# Patient Record
Sex: Female | Born: 2000 | Race: White | Hispanic: No | Smoking: Never smoker
Health system: Southern US, Community
[De-identification: ages and names within clinical notes are randomized; demographics above are authoritative.]

## PROBLEM LIST (undated history)

## (undated) DIAGNOSIS — Z7689 Persons encountering health services in other specified circumstances: Secondary | ICD-10-CM

## (undated) DIAGNOSIS — M549 Dorsalgia, unspecified: Secondary | ICD-10-CM

## (undated) DIAGNOSIS — M419 Scoliosis, unspecified: Secondary | ICD-10-CM

## (undated) DIAGNOSIS — F32A Depression, unspecified: Secondary | ICD-10-CM

## (undated) DIAGNOSIS — F419 Anxiety disorder, unspecified: Secondary | ICD-10-CM

## (undated) DIAGNOSIS — R39198 Other difficulties with micturition: Secondary | ICD-10-CM

## (undated) HISTORY — DX: Anxiety disorder, unspecified: F41.9

## (undated) HISTORY — PX: WISDOM TOOTH EXTRACTION: SHX21

## (undated) HISTORY — DX: Scoliosis, unspecified: M41.9

## (undated) HISTORY — DX: Persons encountering health services in other specified circumstances: Z76.89

## (undated) HISTORY — DX: Other difficulties with micturition: R39.198

## (undated) HISTORY — DX: Depression, unspecified: F32.A

## (undated) HISTORY — DX: Dorsalgia, unspecified: M54.9

---
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
## 2020-09-13 ENCOUNTER — Ambulatory Visit (HOSPITAL_COMMUNITY): Payer: Self-pay | Admitting: Licensed Clinical Social Worker

## 2020-10-10 ENCOUNTER — Ambulatory Visit (INDEPENDENT_AMBULATORY_CARE_PROVIDER_SITE_OTHER): Payer: BC Managed Care – PPO | Admitting: Licensed Clinical Social Worker

## 2020-10-10 DIAGNOSIS — F329 Major depressive disorder, single episode, unspecified: Secondary | ICD-10-CM | POA: Diagnosis not present

## 2020-10-10 DIAGNOSIS — F4010 Social phobia, unspecified: Secondary | ICD-10-CM | POA: Diagnosis not present

## 2020-10-10 DIAGNOSIS — F411 Generalized anxiety disorder: Secondary | ICD-10-CM

## 2020-10-10 NOTE — Progress Notes (Signed)
Virtual Visit via Telephone Note  I connected with Teresa Miller on 10/10/20 at  3:00 PM EST by telephone and verified that I am speaking with the correct person using two identifiers.  Location: Patient: home  Provider: home office   I discussed the limitations, risks, security and privacy concerns of performing an evaluation and management service by telephone and the availability of in person appointments. I also discussed with the patient that there may be a patient responsible charge related to this service. The patient expressed understanding and agreed to proceed.   I discussed the assessment and treatment plan with the patient. The patient was provided an opportunity to ask questions and all were answered. The patient agreed with the plan and demonstrated an understanding of the instructions.   The patient was advised to call back or seek an in-person evaluation if the symptoms worsen or if the condition fails to improve as anticipated.  I provided 40 minutes of non-face-to-face time during this encounter.  THERAPIST PROGRESS NOTE  Session Time: 3:00 PM to 3:40 PM  Participation Level: Active  Behavioral Response: CasualAlertEuthymic  Type of Therapy: Individual Therapy  Treatment Goals addressed:  patient continue to work on mood regulation skills, effective coping strategies, stress management skills Interventions: CBT, DBT, Solution Focused, Strength-based, Supportive and Other: coping  Summary: Teresa Miller is a 20 y.o. female who presents with a couple of months ago got really depressed, a lot of mental break downs like before and didn't know what caused it. She thinks it was just the season. In was just December. Was first Christmas without great Grandfather. He lived with patient. Passed February 20th of 2021. Not sure why, went to hospital and put in rehabilitation place and passed away. Reviewed reason the season might have caused depression, that it is cold  outside and stuck at home. Has been with boyfriend over three years and lives with him. Moved out to [place]. It is a lot of better, only negative is that she misses sister but sees everyday at work. Therapist noted patient yawining in session and she said worked at 12 hours yesterday. Still works at [employer]. Therapist asked about her supports. She was talking to supports telling them she was upset and asking for help. Her sister asked her what happened today, something happen that make her upset. In other words breaking things down, looking more closely at the day to figure out the trigger. She was helping. Usually when depressed at night time and boss sending her to different stores causing and anxiety and panic, new places and night time overwhelming. Talked to bosses asked if could work during the day so not there late at night. When depressed at night when in head and think the worst. She has addressed that. She goes to sleep at 10 PM, and boyfriend there if up and somebody to talk to if need to if wake up during the night. Graduated from high school really improved things. Has a nephew in March or May will be 39 year old. 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 Shared that she likes kids "they are fun they don't judge like the rest of the world." This was a cousin that lived with her. Dad still has a girlfriend, has a fiancee they got engaged. When she wants to have fun sleeps. Works a lot. Body too tired to do anything. On her own better than with family but hard. Still help family out. A little irritated but as long  as they are surviving ok.  Reviewed the need for continued session and patient feels not needed but will contact office if needed.  Therapist reviewed any significant changes in mood and functioning as patient was last seen in April 2020.  Reviewed increase in depression in December which is better.  Bided positive feedback for patient's coping strategy of exploring what had been happening  day to day to uncover what had triggered depression.  Reviewed there is a chain of events and interventions involve addressing and changing the patterns that are causing the depression.  Provided positive feedback for patient using coping strategies when she uncovered sources of depression including talking to her boss and also changing her pattern at night so she did not sit up and think worsening depression.  Encourage patient to do this on an ongoing basis as a coping strategy when she notes a mood shift.  Therapist also utilized CBT to talk about how thoughts and actions can play a part in depression, noticed negative thinking and challenge it also being more active can help with depression as the counteracts the action urge of depression to isolate and do less.  Noted stability patient's life is a positive and noted her supports is helpful.  Reviewed ongoing need for therapy and patient's depression is improved so does not need to follow-up although she will contact therapist when she needs her.  Therapist provided active listening, open questions, supportive interventions Suicidal/Homicidal: No  Plan: 1.patient contact therapist as needed for sessions but currently stable. 2.  Patient continue with helpful strategies she has been using to help with mood management as well as incorporating ones discussed today in session.    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

## 2021-04-27 ENCOUNTER — Ambulatory Visit (HOSPITAL_COMMUNITY): Payer: BC Managed Care – PPO | Admitting: Licensed Clinical Social Worker

## 2021-04-27 NOTE — Progress Notes (Signed)
Therapist contacted patient by phone for session and left a message. Session is a no show  

## 2021-07-27 DIAGNOSIS — J029 Acute pharyngitis, unspecified: Secondary | ICD-10-CM | POA: Diagnosis not present

## 2021-07-27 DIAGNOSIS — R509 Fever, unspecified: Secondary | ICD-10-CM | POA: Diagnosis not present

## 2021-07-27 DIAGNOSIS — R059 Cough, unspecified: Secondary | ICD-10-CM | POA: Diagnosis not present

## 2021-07-27 DIAGNOSIS — Z20822 Contact with and (suspected) exposure to covid-19: Secondary | ICD-10-CM | POA: Diagnosis not present

## 2021-11-07 ENCOUNTER — Ambulatory Visit (INDEPENDENT_AMBULATORY_CARE_PROVIDER_SITE_OTHER): Payer: BC Managed Care – PPO | Admitting: Licensed Clinical Social Worker

## 2021-11-07 DIAGNOSIS — F4323 Adjustment disorder with mixed anxiety and depressed mood: Secondary | ICD-10-CM
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
## 2021-12-06 ENCOUNTER — Ambulatory Visit (HOSPITAL_COMMUNITY): Payer: BC Managed Care – PPO | Admitting: Licensed Clinical Social Worker

## 2021-12-06 NOTE — Progress Notes (Signed)
?  Therapist contacted patient by phone for session and she did not respond. Voice message full could not leave a message. Sessio is a no show ? ? ?

## 2021-12-14 ENCOUNTER — Ambulatory Visit: Payer: BC Managed Care – PPO | Admitting: Medical-Surgical

## 2021-12-25 DIAGNOSIS — Z20822 Contact with and (suspected) exposure to covid-19: Secondary | ICD-10-CM | POA: Diagnosis not present

## 2021-12-25 DIAGNOSIS — B349 Viral infection, unspecified: Secondary | ICD-10-CM | POA: Diagnosis not present

## 2022-07-11 ENCOUNTER — Ambulatory Visit (INDEPENDENT_AMBULATORY_CARE_PROVIDER_SITE_OTHER): Payer: BC Managed Care – PPO | Admitting: Family Medicine

## 2022-07-11 DIAGNOSIS — Z91199 Patient's noncompliance with other medical treatment and regimen due to unspecified reason: Secondary | ICD-10-CM

## 2022-07-11 NOTE — Progress Notes (Signed)
No Show

## 2022-07-29 ENCOUNTER — Ambulatory Visit: Payer: BC Managed Care – PPO | Admitting: Family Medicine

## 2022-08-01 ENCOUNTER — Ambulatory Visit: Payer: BC Managed Care – PPO | Admitting: Family Medicine

## 2022-09-04 DIAGNOSIS — R300 Dysuria: Secondary | ICD-10-CM | POA: Diagnosis not present

## 2022-09-04 DIAGNOSIS — R319 Hematuria, unspecified: Secondary | ICD-10-CM | POA: Diagnosis not present

## 2022-09-23 DIAGNOSIS — Z3042 Encounter for surveillance of injectable contraceptive: Secondary | ICD-10-CM | POA: Diagnosis not present

## 2022-11-11 ENCOUNTER — Encounter: Payer: Self-pay | Admitting: Family Medicine

## 2022-11-11 ENCOUNTER — Ambulatory Visit (INDEPENDENT_AMBULATORY_CARE_PROVIDER_SITE_OTHER): Payer: BC Managed Care – PPO | Admitting: Family Medicine

## 2022-11-11 VITALS — BP 113/82 | HR 105 | Ht 62.0 in | Wt 95.0 lb

## 2022-11-11 DIAGNOSIS — Z0000 Encounter for general adult medical examination without abnormal findings: Secondary | ICD-10-CM | POA: Diagnosis not present

## 2022-11-11 NOTE — Progress Notes (Signed)
Subjective:    CC: Establish care.   HPI:   Pt presents today for new patient visit and physical.   Current tobacco smoker: vapes; changes cartridges 2-3 weeks  Mom-thyroid cancer   Caffeine: 3 monsters a day   Pt admits to temperature changes, brittle hair   Past medical history, Surgical history, Family history not pertinant except as noted below, Social history, Allergies, and medications have been entered into the medical record, reviewed, and no changes needed.   Review of Systems: No headache, visual changes, nausea, vomiting, diarrhea, constipation, dizziness, abdominal pain, skin rash, fevers, chills, night sweats, swollen lymph nodes, weight loss, chest pain, body aches, joint swelling, muscle aches, shortness of breath, mood changes, visual or auditory hallucinations.  Objective:    General: Well Developed, well nourished, and in no acute distress.  Neuro: Alert and oriented x3, extra-ocular muscles intact, sensation grossly intact.  HEENT: Normocephalic, atraumatic, pupils equal round reactive to light, neck supple, no masses, no lymphadenopathy, thyroid nonpalpable.  Skin: Warm and dry, no rashes noted.  Cardiac: Regular rate and rhythm, no murmurs rubs or gallops.  Respiratory: Clear to auscultation bilaterally. Not using accessory muscles, speaking in full sentences.  Abdominal: Soft, nontender, nondistended, positive bowel sounds, no masses, no organomegaly.  Musculoskeletal: Shoulder, elbow, wrist, hip, knee, ankle stable, and with full range of motion.  Impression and Recommendations:    The patient was counselled, risk factors were discussed, anticipatory guidance given.  Adult health maintenance  - due for pap--pt will come at a later date  - ordered CBC, CMP, TSH (symptoms of temperature changes, brittle nails, constipation), lipid panel, and A1C (family hx of T1DM)    Angelys Yetman S. Mel Almond, DO

## 2022-11-11 NOTE — Patient Instructions (Signed)
So nice meeting you today!   Keep track of your uti symptoms and bowel movements  Come back for blood work (no fasting required)   Come back in one month for pap smear

## 2022-11-12 ENCOUNTER — Ambulatory Visit: Payer: BC Managed Care – PPO | Admitting: Family Medicine

## 2022-11-20 ENCOUNTER — Ambulatory Visit (INDEPENDENT_AMBULATORY_CARE_PROVIDER_SITE_OTHER): Payer: BC Managed Care – PPO | Admitting: Family Medicine

## 2022-11-20 DIAGNOSIS — Z91199 Patient's noncompliance with other medical treatment and regimen due to unspecified reason: Secondary | ICD-10-CM

## 2022-11-20 NOTE — Progress Notes (Signed)
     Established patient visit   Patient: Teresa Miller   DOB: 2000/11/19   22 y.o. Female  MRN: [id number] Visit Date: 11/20/2022   No Show   Owens Loffler, DO  [facility] [phone] (phone) [phone] (fax)  [place]

## 2023-03-12 DIAGNOSIS — R012 Other cardiac sounds: Secondary | ICD-10-CM | POA: Diagnosis not present

## 2023-03-12 DIAGNOSIS — R142 Eructation: Secondary | ICD-10-CM | POA: Diagnosis not present

## 2023-07-22 ENCOUNTER — Ambulatory Visit (INDEPENDENT_AMBULATORY_CARE_PROVIDER_SITE_OTHER): Payer: BC Managed Care – PPO | Admitting: Family Medicine

## 2023-07-22 VITALS — BP 120/84 | HR 93 | Ht 62.0 in | Wt 99.2 lb

## 2023-07-22 DIAGNOSIS — R142 Eructation: Secondary | ICD-10-CM | POA: Diagnosis not present

## 2023-07-22 NOTE — Progress Notes (Signed)
Established patient visit   Patient: Teresa Miller   DOB: Jul 02, 2001   22 y.o. Female  MRN: [id number] Visit Date: 07/22/2023  Today's healthcare provider: Charlton Amor, DO   Chief Complaint  Patient presents with   burping    X 3 months    SUBJECTIVE    Chief Complaint  Patient presents with   burping    X 3 months   HPI HPI     burping    Additional comments: X 3 months      Last edited by Roselyn Reef, CMA on 07/22/2023  9:41 AM.      Pt presents with concerns of burping for the past three months. She went to urgent care in July for this and was given a prescription of pantoprazole. She says this has not helped.   Review of Systems  Constitutional:  Negative for activity change, fatigue and fever.  Respiratory:  Negative for cough and shortness of breath.   Cardiovascular:  Negative for chest pain.  Gastrointestinal:  Negative for abdominal pain.       Burping  Genitourinary:  Negative for difficulty urinating.       No outpatient medications have been marked as taking for the 07/22/23 encounter (Office Visit) with Charlton Amor, DO.    OBJECTIVE    BP 120/84 (BP Location: Left Arm, Patient Position: Sitting, Cuff Size: Small)   Pulse 93   Ht 5\' 2"  (1.575 m)   Wt 99 lb 4 oz (45 kg)   SpO2 100%   BMI 18.15 kg/m   Physical Exam Vitals and nursing note reviewed.  Constitutional:      General: She is not in acute distress.    Appearance: Normal appearance.  HENT:     Head: Normocephalic and atraumatic.     Right Ear: External ear normal.     Left Ear: External ear normal.     Nose: Nose normal.  Eyes:     Conjunctiva/sclera: Conjunctivae normal.  Cardiovascular:     Rate and Rhythm: Normal rate and regular rhythm.  Pulmonary:     Effort: Pulmonary effort is normal.     Breath sounds: Normal breath sounds.  Abdominal:     General: Abdomen is flat. Bowel sounds are normal.     Palpations: Abdomen is soft.     Tenderness: There is  no abdominal tenderness.  Neurological:     General: No focal deficit present.     Mental Status: She is alert and oriented to person, place, and time.  Psychiatric:        Mood and Affect: Mood normal.        Behavior: Behavior normal.        Thought Content: Thought content normal.        Judgment: Judgment normal.        ASSESSMENT & PLAN    Problem List Items Addressed This Visit       Other   Burping - Primary    Belching is indicative of excessive gas/bloating - will do a trial of ppi. She only took ppi for 2 days and stopped. I suggested we do it for more like 2 weeks  - also provided pt with a diaphragmatic breathing exercise to try and reduce belching - discussed dietary modifications--reduce gas producing foods-given low FODMAP diet to follow - cbc ordered to eval for anemia and celiac sprue - referral to GI if not better      Relevant  Orders   CBC with Differential   Ambulatory referral to Gastroenterology     No follow-ups on file.      No orders of the defined types were placed in this encounter.   Orders Placed This Encounter  Procedures   CBC with Differential   Ambulatory referral to Gastroenterology    Referral Priority:   Routine    Referral Type:   Consultation    Referral Reason:   Specialty Services Required    Number of Visits Requested:   1     Charlton Amor, DO  [facility] at [facility] [phone] (phone) [phone] (fax)  [facility]

## 2023-07-22 NOTE — Assessment & Plan Note (Addendum)
Belching is indicative of excessive gas/bloating - will do a trial of ppi. She only took ppi for 2 days and stopped. I suggested we do it for more like 2 weeks  - also provided pt with a diaphragmatic breathing exercise to try and reduce belching - discussed dietary modifications--reduce gas producing foods-given low FODMAP diet to follow - cbc ordered to eval for anemia and celiac sprue - referral to GI if not better

## 2023-08-05 ENCOUNTER — Encounter: Payer: Self-pay | Admitting: Pediatrics

## 2023-09-25 NOTE — Progress Notes (Deleted)
[facility] Initial Consultation   Referring Provider Charlton Amor, DO [address] [place],  [place] [address]  Primary Care Provider Charlton Amor, DO  Patient Profile: Teresa Miller is a 23 y.o. female who is seen in consultation in the [facility] at the request of Dr. Tamera Punt for evaluation and management of the problem(s) noted below.  Problem List: Belching/eructation   History of Present Illness   Teresa Miller is a 23 y.o. female with a history of ***.   Last colonoscopy: *** Last endoscopy: ***  Last Abd CT/CTE/MRE: ***  GI Review of Symptoms Significant for {GIROS:50592}. Otherwise negative.  General Review of Systems  Review of systems is significant for the pertinent positives and negatives as listed per the HPI.  Full ROS is otherwise negative.  Past Medical History   Past Medical History:  Diagnosis Date   Anxiety    Back pain    Depression    Difficulty urinating    Mild scoliosis    Sleep concern      Past Surgical History   Past Surgical History:  Procedure Laterality Date   WISDOM TOOTH EXTRACTION     age [age]     Allergies and Medications   Allergies  Allergen Reactions   Penicillins     @MEDSTODAY @  Family History   Family History  Problem Relation Age of Onset   Hypertension Mother    Cancer Mother    Thyroid disease Mother    Hyperlipidemia Mother    Depression Mother    Anxiety disorder Mother    Diabetes Father    Diabetes Paternal Aunt    Diabetes Paternal Grandmother    Diabetes Paternal Grandfather    Stroke Other      Social History   Social History   Tobacco Use   Smoking status: Never   Smokeless tobacco: Never  Vaping Use   Vaping status: Every Day   Substances: Nicotine, Flavoring  Substance Use Topics   Alcohol use: Never   Drug use: Never   Talya reports that she has never smoked. She has never used smokeless tobacco. She reports that she  does not drink alcohol and does not use drugs.  Vital Signs and Physical Examination  There were no vitals filed for this visit. There is no height or weight on file to calculate BMI.    General: Well developed, well nourished, no acute distress Head: Normocephalic and atraumatic Eyes: Sclerae anicteric, EOMI Ears: Normal auditory acuity Mouth: No deformities or lesions noted Lungs: Clear throughout to auscultation Heart: Regular rate and rhythm; No murmurs, rubs or bruits Abdomen: Soft, non tender and non distended. No masses, hepatosplenomegaly or hernias noted. Normal Bowel sounds Rectal: Musculoskeletal: Symmetrical with no gross deformities  Pulses:  Normal pulses noted Extremities: No edema or deformities noted Neurological: Alert oriented x 4, grossly nonfocal Psychological:  Alert and cooperative. Normal mood and affect  Review of Data  The following data was reviewed at the time of this encounter:  Laboratory Studies       No data to display          No results found for: "LIPASE"     No data to display           Imaging Studies    GI Procedures and Studies      Clinical Impression  It is my clinical impression that Teresa Miller is a 23 y.o. female with;  ***  Plan  *** *** *** *** ***  Planned Follow Up No follow-ups on file.  The patient or caregiver verbalized understanding of the material covered, with no barriers to understanding. All questions were answered. Patient or caregiver is agreeable with the plan outlined above.    It was a pleasure to see Teresa Miller.  If you have any questions or concerns regarding this evaluation, do not hesitate to contact me.  Maren Beach, MD [facility]

## 2023-09-26 ENCOUNTER — Ambulatory Visit: Payer: BC Managed Care – PPO | Admitting: Pediatrics

## 2023-09-26 DIAGNOSIS — R142 Eructation: Secondary | ICD-10-CM

## 2024-01-08 ENCOUNTER — Encounter: Payer: Self-pay | Admitting: Family Medicine
